# Patient Record
Sex: Male | Born: 1955 | Race: White | Hispanic: No | Marital: Married | Smoking: Never smoker
Health system: Southern US, Community
[De-identification: ages and names within clinical notes are randomized; demographics above are authoritative.]

## PROBLEM LIST (undated history)

## (undated) DIAGNOSIS — I10 Essential (primary) hypertension: Secondary | ICD-10-CM

## (undated) HISTORY — PX: OTHER SURGICAL HISTORY: SHX169

---

## 1998-02-14 ENCOUNTER — Emergency Department (HOSPITAL_COMMUNITY): Admission: EM | Admit: 1998-02-14 | Discharge: 1998-02-14 | Payer: Self-pay | Admitting: Emergency Medicine

## 1999-09-26 ENCOUNTER — Emergency Department (HOSPITAL_COMMUNITY): Admission: EM | Admit: 1999-09-26 | Discharge: 1999-09-26 | Payer: Self-pay | Admitting: Emergency Medicine

## 2002-12-09 ENCOUNTER — Inpatient Hospital Stay (HOSPITAL_COMMUNITY): Admission: EM | Admit: 2002-12-09 | Discharge: 2002-12-11 | Payer: Self-pay | Admitting: Emergency Medicine

## 2004-11-06 IMAGING — NM NM MYOCAR MULTI W/ SPECT
15 series · 60 of 60 positions shown · non-contrast
Comparison: None.

CLINICAL DATA: Chest pain.
 MYOCARDIAL PERFUSION WITH SPECT ? 12/11/02
TECHNIQUE: Zechnetium-EEm sestamibi 30 mCi was injected at rest. 10 mCi of the same radiopharmaceutical was injected after exercise stress using the Bruce protocol. The patient achieved 85% of the maximum predicted heart rate for age.

[Series 1: rc rest cardiolite · 6.8mm · 6.85mm/px · 4 of 17 frames shown (1 of 7)]
[frame 2/17]
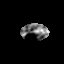
[frame 7/17]
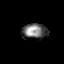
[frame 10/17]
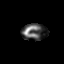
[frame 16/17]
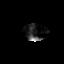

[Series 1: rc rest cardiolite · 6.8mm · 6.85mm/px · 4 of 17 frames shown (2 of 7)]
[frame 2/17]
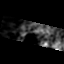
[frame 7/17]
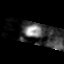
[frame 10/17]
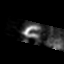
[frame 16/17]
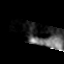

[Series 1: rc rest cardiolite · 6.85mm/px · 4 of 64 frames shown (3 of 7)]
[frame 6/64]
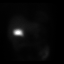
[frame 27/64]
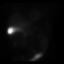
[frame 38/64]
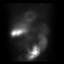
[frame 59/64]
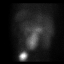

[Series 1: sc stress gated cardio · 6.8mm · 6.85mm/px · 4 of 25 frames shown (1 of 8)]
[frame 3/25]
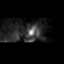
[frame 11/25]
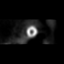
[frame 15/25]
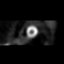
[frame 23/25]
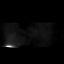

[Series 1: sc stress gated cardio · 6.85mm/px · 4 of 64 frames shown (2 of 8)]
[frame 6/64]
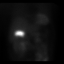
[frame 27/64]
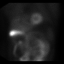
[frame 38/64]
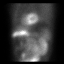
[frame 59/64]
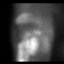

[Series 1: sc stress gated cardio · 6.8mm · 6.85mm/px · 4 of 17 frames shown (3 of 8)]
[frame 4/17]
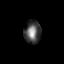
[frame 7/17]
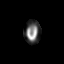
[frame 13/17]
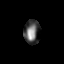
[frame 16/17]
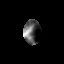

[Series 1: sc stress gated cardio · 6.8mm · 6.85mm/px · 4 of 17 frames shown (4 of 8)]
[frame 4/17]
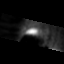
[frame 7/17]
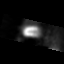
[frame 13/17]
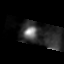
[frame 16/17]
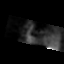

[Series 1: sc stress gated cardio · 6.8mm · 6.85mm/px · 4 of 17 frames shown (5 of 8)]
[frame 4/17]
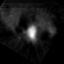
[frame 7/17]
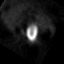
[frame 13/17]
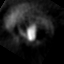
[frame 16/17]
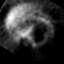

[Series 1: rc rest cardiolite · 6.8mm · 6.85mm/px · 4 of 25 frames shown (4 of 7)]
[frame 3/25]
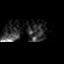
[frame 7/25]
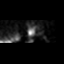
[frame 15/25]
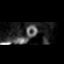
[frame 19/25]
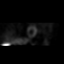

[Series 1: sc stress gated cardio · 6.8mm · 6.85mm/px · 4 of 17 frames shown (6 of 8)]
[frame 2/17]
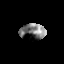
[frame 4/17]
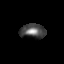
[frame 10/17]
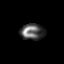
[frame 13/17]
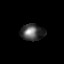

[Series 1: sc stress gated cardio · 6.85mm/px · 4 of 512 frames shown (7 of 8)]
[frame 43/512]
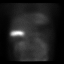
[frame 214/512]
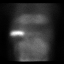
[frame 299/512]
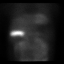
[frame 470/512]
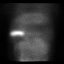

[Series 1: sc stress gated cardio · 6.8mm · 6.85mm/px · 4 of 25 frames shown (8 of 8)]
[frame 3/25]
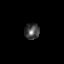
[frame 11/25]
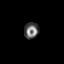
[frame 15/25]
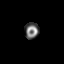
[frame 23/25]
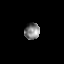

[Series 1: rc rest cardiolite · 6.8mm · 6.85mm/px · 4 of 25 frames shown (5 of 7)]
[frame 3/25]
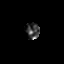
[frame 11/25]
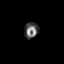
[frame 15/25]
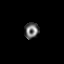
[frame 23/25]
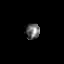

[Series 1: rc rest cardiolite · 6.8mm · 6.85mm/px · 4 of 17 frames shown (6 of 7)]
[frame 2/17]
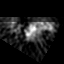
[frame 7/17]
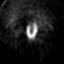
[frame 10/17]
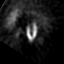
[frame 16/17]
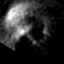

[Series 1: rc rest cardiolite · 6.8mm · 6.85mm/px · 4 of 17 frames shown (7 of 7)]
[frame 2/17]
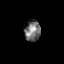
[frame 7/17]
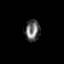
[frame 10/17]
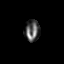
[frame 16/17]
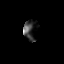

[60 of 60 positions shown; findings below may reference images not displayed]

FINDINGS: SPECT imaging shows a small fixed defect in the inferior wall extending from the midsegment to the apex. This has similar features on both rest and stress imaging. No other fixed or reversible perfusion defects are identified within the left ventricular myocardium. 
 WALL MOTION
 Surface-rendered images obtained with cardiac gating show no segmental wall motion abnormality. 
 EJECTION FRACTION
 The left ventricular end-diastolic volume is 118 cc, and the left ventricular end-systolic volume is 35 cc. The derived left ventricular ejection fraction is 70%.
IMPRESSION: Probable diaphragmatic attenuation involving the mid-distal inferior wall. No definite ischemia or infarct.
 Normal left ventricular ejection fraction.

## 2011-03-05 ENCOUNTER — Emergency Department (HOSPITAL_COMMUNITY)
Admission: EM | Admit: 2011-03-05 | Discharge: 2011-03-05 | Disposition: A | Discharge status: Home / Self Care | Payer: BC Managed Care – PPO | Attending: Emergency Medicine | Admitting: Emergency Medicine

## 2011-03-05 ENCOUNTER — Encounter (HOSPITAL_COMMUNITY): Payer: Self-pay | Admitting: *Deleted

## 2011-03-05 DIAGNOSIS — K645 Perianal venous thrombosis: Secondary | ICD-10-CM

## 2011-03-05 DIAGNOSIS — I10 Essential (primary) hypertension: Secondary | ICD-10-CM | POA: Insufficient documentation

## 2011-03-05 DIAGNOSIS — H109 Unspecified conjunctivitis: Secondary | ICD-10-CM

## 2011-03-05 HISTORY — DX: Essential (primary) hypertension: I10

## 2011-03-05 MED ORDER — TOBRAMYCIN 0.3 % OP SOLN
2.0000 [drp] | OPHTHALMIC | Status: DC
  Administered 2011-03-05: 2 [drp] via OPHTHALMIC
  Filled 2011-03-05: qty 5

## 2011-03-05 NOTE — Discharge Instructions (Signed)
You were seen and treated today for your hemorrhoid pains. Your hemorrhoid was opened to remove the clots and help with your symptoms. Is recommended to use a stool softener daily to have soft stools and continue to help with your symptoms. You may also use warm soaks to help with healing. You were also evaluated today for your complaints of eye irritation and drainage. He has been provided eyedrops to use to help prevent infection and help with symptoms. Please use these by placing 2 drops every 4 hours for the next 5 days. Please followup with your primary doctor next week for continued evaluation of your symptoms.  Hemorrhoids Hemorrhoids are enlarged (dilated) veins around the rectum. There are 2 types of hemorrhoids, and the type of hemorrhoid is determined by its location. Internal hemorrhoids occur in the veins just inside the rectum.They are usually not painful, but they may bleed.However, they may poke through to the outside and become irritated and painful. External hemorrhoids involve the veins outside the anus and can be felt as a painful swelling or hard lump near the anus.They are often itchy and may crack and bleed. Sometimes clots will form in the veins. This makes them swollen and painful. These are called thrombosed hemorrhoids. CAUSES Causes of hemorrhoids include:  Pregnancy. This increases the pressure in the hemorrhoidal veins.   Constipation.   Straining to have a bowel movement.   Obesity.   Heavy lifting or other activity that caused you to strain.  TREATMENT Most of the time hemorrhoids improve in 1 to 2 weeks. However, if symptoms do not seem to be getting better or if you have a lot of rectal bleeding, your caregiver may perform a procedure to help make the hemorrhoids get smaller or remove them completely.Possible treatments include:  Rubber band ligation. A rubber band is placed at the base of the hemorrhoid to cut off the circulation.   Sclerotherapy. A  chemical is injected to shrink the hemorrhoid.   Infrared light therapy. Tools are used to burn the hemorrhoid.   Hemorrhoidectomy. This is surgical removal of the hemorrhoid.  HOME CARE INSTRUCTIONS   Increase fiber in your diet. Ask your caregiver about using fiber supplements.   Drink enough water and fluids to keep your urine clear or pale yellow.   Exercise regularly.   Go to the bathroom when you have the urge to have a bowel movement. Do not wait.   Avoid straining to have bowel movements.   Keep the anal area dry and clean.   Only take over-the-counter or prescription medicines for pain, discomfort, or fever as directed by your caregiver.  If your hemorrhoids are thrombosed:  Take warm sitz baths for 20 to 30 minutes, 3 to 4 times per day.   If the hemorrhoids are very tender and swollen, place ice packs on the area as tolerated. Using ice packs between sitz baths may be helpful. Fill a plastic bag with ice. Place a towel between the bag of ice and your skin.   Medicated creams and suppositories may be used or applied as directed.   Do not use a donut-shaped pillow or sit on the toilet for long periods. This increases blood pooling and pain.  SEEK MEDICAL CARE IF:   You have increasing pain and swelling that is not controlled with your medicine.   You have uncontrolled bleeding.   You have difficulty or you are unable to have a bowel movement.   You have pain or inflammation outside the  area of the hemorrhoids.   You have chills or an oral temperature above 102 F (38.9 C).  MAKE SURE YOU:   Understand these instructions.   Will watch your condition.   Will get help right away if you are not doing well or get worse.  Document Released: 12/25/1999 Document Revised: 09/08/2010 Document Reviewed: 05/01/2007 [facility] Patient Information 2012 [place], [place].   Conjunctivitis Conjunctivitis is commonly called "pink eye." Conjunctivitis can be caused by  bacterial or viral infection, allergies, or injuries. There is usually redness of the lining of the eye, itching, discomfort, and sometimes discharge. There may be deposits of matter along the eyelids. A viral infection usually causes a watery discharge, while a bacterial infection causes a yellowish, thick discharge. Pink eye is very contagious and spreads by direct contact. You may be given antibiotic eyedrops as part of your treatment. Before using your eye medicine, remove all drainage from the eye by washing gently with warm water and cotton balls. Continue to use the medication until you have awakened 2 mornings in a row without discharge from the eye. Do not rub your eye. This increases the irritation and helps spread infection. Use separate towels from other household members. Wash your hands with soap and water before and after touching your eyes. Use cold compresses to reduce pain and sunglasses to relieve irritation from light. Do not wear contact lenses or wear eye makeup until the infection is gone. SEEK MEDICAL CARE IF:   Your symptoms are not better after 3 days of treatment.   You have increased pain or trouble seeing.   The outer eyelids become very red or swollen.  Document Released: 02/04/2004 Document Revised: 09/08/2010 Document Reviewed: 12/27/2004 [facility] Patient Information 2012 [place], [place].

## 2011-03-05 NOTE — ED Notes (Signed)
Patient presents to ED with c/o a "hemmorhoid flare up" that started yesterday around lunchtime. Reports having hx of hemorrhoidectomy approx. 8 years ago.

## 2011-03-05 NOTE — ED Provider Notes (Signed)
History     CSN: [id number]  Arrival date & time 03/05/11  0434   First MD Initiated Contact with Patient 03/05/11 0440      Chief Complaint  Patient presents with  . Hemorrhoids     HPI  History provided by the patient. Patient is a 56 year old male with history of hypertension and previous hemorrhoids who presents with complaints of "hemorrhoid" pains. Patient reports having increasing pain around the rectum from "hemorrhoid" that began yesterday afternoon and evening. Patient denies having any recent diarrhea or constipation. Symptoms became worse after attending the dual caused a combination of and laughing. Patient was trying to rest and use over-the-counter pain medications without significant relief. Pain is worse with pressure sitting. Patient denies any other aggravating or alleviating factors. He denies having any bleeding. Patient also has second complaint of left eye irritation, redness and drainage. Patient does report being on prednisone recently for rash to right arm. He reports having one day remaining of prednisone dose. I irritation began about 4 days ago. He denies any nasal congestion, rhinorrhea, cough, sinus pressure, fever, chills. Patient denies being around any known sick contacts.    Past Medical History  Diagnosis Date  . Hypertension     Past Surgical History  Procedure Date  . Hemmorhoidectomy     No family history on file.  History  Substance Use Topics  . Smoking status: Not on file  . Smokeless tobacco: Not on file  . Alcohol Use:       Review of Systems  Constitutional: Negative for fever and chills.  HENT: Negative for congestion and rhinorrhea.   Eyes: Positive for discharge and redness. Negative for photophobia, pain and visual disturbance.  Respiratory: Negative for cough and shortness of breath.   Gastrointestinal: Negative for abdominal pain, diarrhea, constipation and blood in stool.  All other systems reviewed and are  negative.    Allergies  Review of patient's allergies indicates no known allergies.  Home Medications   Current Outpatient Rx  Name Route Sig Dispense Refill  . LISINOPRIL 10 MG PO TABS Oral Take 10 mg by mouth daily.      BP 148/72  Pulse 82  Temp(Src) 98.7 F (37.1 C) (Oral)  Resp 18  SpO2 98%  Physical Exam  Nursing note and vitals reviewed. Constitutional: He is oriented to person, place, and time. He appears well-developed and well-nourished. No distress.  HENT:  Head: Normocephalic and atraumatic.  Mouth/Throat: Oropharynx is clear and moist.  Eyes: EOM are normal. Pupils are equal, round, and reactive to light.       Erythema and swelling of the conjunctiva of the left eye. There is crusting with slight matting. Sclera appears normal.  Cardiovascular: Normal rate and regular rhythm.   Pulmonary/Chest: Effort normal and breath sounds normal. No respiratory distress. He has no wheezes. He has no rales.  Abdominal: Soft. He exhibits no distension. There is no tenderness.  Genitourinary:          Tender thrombosed hemorrhoid on left rectum area  Neurological: He is alert and oriented to person, place, and time.  Skin: Skin is warm. No rash noted.  Psychiatric: He has a normal mood and affect. His behavior is normal.    ED Course  Procedures   HEMORRHOIDECTOMY   Performed by: Angus Seller Consent: Verbal consent obtained from patient. Risks and benefits: risks, benefits and alternatives were discussed Type: Thrombosed hemorrhoid   Body area: Right lateral rectum  Anesthesia: local infiltration  Local anesthetic: lidocaine 2 % with epinephrine  Anesthetic total: 2 ml  Complexity: Simple  Blood clots removed   Patient tolerance: Patient tolerated the procedure well with no immediate complications.     1. Conjunctivitis   2. External hemorrhoid, thrombosed       MDM  4:50 AM patient is evaluated. Patient in no acute  distress.        Angus Seller, [place] 03/05/11 [phone]

## 2011-03-05 NOTE — ED Notes (Signed)
Ivonne Andrew, PA and [place], [place] to bedside for incision and drainage of hemorrhoid.

## 2011-03-05 NOTE — ED Provider Notes (Signed)
Medical screening examination/treatment/procedure(s) were performed by non-physician practitioner and as supervising physician I was immediately available for consultation/collaboration.   Doloras Tellado, MD 03/05/11 0543 

## 2011-03-08 ENCOUNTER — Encounter (HOSPITAL_COMMUNITY): Payer: Self-pay | Admitting: Emergency Medicine

## 2011-03-08 ENCOUNTER — Emergency Department (HOSPITAL_COMMUNITY)
Admission: EM | Admit: 2011-03-08 | Discharge: 2011-03-08 | Disposition: A | Discharge status: Home / Self Care | Payer: BC Managed Care – PPO | Attending: Emergency Medicine | Admitting: Emergency Medicine

## 2011-03-08 DIAGNOSIS — K6289 Other specified diseases of anus and rectum: Secondary | ICD-10-CM | POA: Insufficient documentation

## 2011-03-08 DIAGNOSIS — K645 Perianal venous thrombosis: Secondary | ICD-10-CM | POA: Insufficient documentation

## 2011-03-08 DIAGNOSIS — Z79899 Other long term (current) drug therapy: Secondary | ICD-10-CM | POA: Insufficient documentation

## 2011-03-08 DIAGNOSIS — I10 Essential (primary) hypertension: Secondary | ICD-10-CM | POA: Insufficient documentation

## 2011-03-08 MED ORDER — HYDROCODONE-ACETAMINOPHEN 5-500 MG PO TABS: 1.0000 | ORAL_TABLET | Freq: Four times a day (QID) | ORAL | Status: AC | PRN

## 2011-03-08 MED ORDER — NAPROXEN 500 MG PO TABS: 500.0000 mg | ORAL_TABLET | Freq: Two times a day (BID) | ORAL | Status: AC

## 2011-03-08 MED ORDER — LIDOCAINE HCL 1 % IJ SOLN
5.0000 mL | Freq: Once | INTRAMUSCULAR | Status: AC
  Administered 2011-03-08: 5 mL via INTRADERMAL
  Filled 2011-03-08: qty 20

## 2011-03-08 NOTE — ED Provider Notes (Signed)
History     CSN: [id number]  Arrival date & time 03/08/11  0118   First MD Initiated Contact with Patient 03/08/11 [phone]      Chief Complaint  Patient presents with  . Hemorrhoids    (Consider location/radiation/quality/duration/timing/severity/associated sxs/prior treatment) HPI Comments: 56 year old male with history of hemorrhoid who presents approximately 3 days after excision of the thrombosed hemorrhoid. He states that initially this improved his pain but over the last 24 hours he has developed this recurrent swelling and fullness in his anus that feels similar to prior hemorrhoid. Symptoms are constant, gradually getting worse, moderate to severe and worse with sitting and palpation. He denies any rectal bleeding, vomiting, fevers, abdominal pain.  The history is provided by the patient and medical records.    Past Medical History  Diagnosis Date  . Hypertension     Past Surgical History  Procedure Date  . Hemmorhoidectomy     No family history on file.  History  Substance Use Topics  . Smoking status: Not on file  . Smokeless tobacco: Not on file  . Alcohol Use:       Review of Systems  All other systems reviewed and are negative.    Allergies  Review of patient's allergies indicates no known allergies.  Home Medications   Current Outpatient Rx  Name Route Sig Dispense Refill  . HYDROCODONE-ACETAMINOPHEN 5-500 MG PO TABS Oral Take 1-2 tablets by mouth every 6 (six) hours as needed for pain. 15 tablet 0  . LISINOPRIL 10 MG PO TABS Oral Take 10 mg by mouth daily.    Marland Kitchen NAPROXEN 500 MG PO TABS Oral Take 1 tablet (500 mg total) by mouth 2 (two) times daily with a meal. 30 tablet 0    BP 138/92  Pulse 70  Temp(Src) 98.4 F (36.9 C) (Oral)  Resp 18  Wt 195 lb (88.451 kg)  SpO2 98%  Physical Exam  Nursing note and vitals reviewed. Constitutional: He appears well-developed and well-nourished. No distress.  HENT:  Head: Normocephalic and atraumatic.    Mouth/Throat: Oropharynx is clear and moist. No oropharyngeal exudate.  Eyes: Conjunctivae and EOM are normal. Pupils are equal, round, and reactive to light. Right eye exhibits no discharge. Left eye exhibits no discharge. No scleral icterus.  Neck: Normal range of motion. Neck supple. No JVD present. No thyromegaly present.  Cardiovascular: Normal rate, regular rhythm, normal heart sounds and intact distal pulses.  Exam reveals no gallop and no friction rub.   No murmur heard. Pulmonary/Chest: Effort normal and breath sounds normal. No respiratory distress. He has no wheezes. He has no rales.  Abdominal: Soft. Bowel sounds are normal. He exhibits no distension and no mass. There is no tenderness.  Genitourinary:       Thrombosed external hemorrhoid in the right perianal area. This is tender to palpation, nonbleeding  Musculoskeletal: Normal range of motion. He exhibits no edema and no tenderness.  Lymphadenopathy:    He has no cervical adenopathy.  Neurological: He is alert. Coordination normal.  Skin: Skin is warm and dry. No rash noted. No erythema.  Psychiatric: He has a normal mood and affect. His behavior is normal.    ED Course  Procedures (including critical care time)  Labs Reviewed - No data to display No results found.   1. Thrombosed external hemorrhoid       MDM  Patient appears to have recurrent thrombosed hemorrhoid, will require incision and drainage, otherwise appears stable  INCISION AND DRAINAGE Performed  by: [facility] Consent: Verbal consent obtained. Risks and benefits: risks, benefits and alternatives were discussed Type: abscess  Body area: Thrombosed external hemorrhoid  Anesthesia: local infiltration  Local anesthetic: lidocaine 1 % without epinephrine  Anesthetic total: 0.5 ml  Complexity: complex Blunt dissection to break up loculations  Drainage: Blood clot   Drainage amount: 1 large clot   Packing material: None   Patient  tolerance: Patient tolerated the procedure well with no immediate complications.         Vida Roller, MD 03/08/11 [phone]

## 2011-03-08 NOTE — ED Notes (Signed)
Pt states he began hurting around 0800 this morning and it kept getting worse throughout the day.  Could not sleep due to the pain in his bottom.  Pain 7/10.

## 2011-03-08 NOTE — ED Notes (Signed)
Pt alert, nad, c/o rectal pain and itching associated with a hemorrhoids, resp even unlabored, skin pwd

## 2011-03-08 NOTE — Discharge Instructions (Signed)
Please take a laxative, use SITZ baths twice daily - get from pharmacy - and return to your doctor in 2 days for a recheck.  RESOURCE GUIDE  Dental Problems  Patients with Medicaid: [facility] [phone] [address]                                           [phone] [address] Phone:  [phone]                                                  Phone:  [phone]  If unable to pay or uninsured, contact:  [facility] or [facility]. to become qualified for the adult dental clinic.  Chronic Pain Problems Contact [facility]  [phone] Patients need to be referred by their primary care doctor.  Insufficient Money for Medicine Contact [employer]:  call "211" or [facility] [phone] [phone].  No Primary Care Doctor Call [facility]  [phone] Other agencies that provide inexpensive medical care    [facility]  [phone]    [facility]  [phone]    [facility]  [phone]    [facility]  [phone]    [facility]  [phone]    [facility]  [phone]  Psychological Services [facility]  [phone] [facility]  [phone] [facility]   [phone] (emergency services [phone])  Substance Abuse Resources [facility]  [phone] [facility] [phone] [facility] [phone] Floydene Flock [phone] Residential & Outpatient Substance Abuse Program  [phone]  Abuse/Neglect [facility] Child Abuse Hotline (931)794-5917 [facility] Child Abuse Hotline (949)839-2252 (After Hours)  Emergency Shelter [facility] [phone]  M[facility] 617-503-9284 [facility] [phone]  MRSA Hotline #:   763-161-6851    [facility] Resources  [facility]     [employer]                          [facility] Dept. [address] [place]                       [address]      [address]  [place]                                                Cristobal Goldmann Phone:  [phone]                                   Phone:  [phone]  Phone:  [phone]  [facility] Phone:  [phone]  [facility] Child Abuse Hotline 807-031-2190 [phone] (After Hours)

## 2011-03-08 NOTE — ED Notes (Signed)
Hemorrhoid decompressed and clot removed by Eber Hong, MD.  Marcelle Overlie at bedside during procedure.  Moderate sized clot removed.  Pt tolerated well.

## 2013-06-07 ENCOUNTER — Encounter (HOSPITAL_COMMUNITY): Payer: Self-pay | Admitting: Emergency Medicine

## 2013-06-07 ENCOUNTER — Emergency Department (HOSPITAL_COMMUNITY)
Admission: EM | Admit: 2013-06-07 | Discharge: 2013-06-07 | Disposition: A | Discharge status: Home / Self Care | Payer: BC Managed Care – PPO | Attending: Emergency Medicine | Admitting: Emergency Medicine

## 2013-06-07 ENCOUNTER — Emergency Department (HOSPITAL_COMMUNITY): Payer: BC Managed Care – PPO

## 2013-06-07 DIAGNOSIS — S62639B Displaced fracture of distal phalanx of unspecified finger, initial encounter for open fracture: Secondary | ICD-10-CM | POA: Insufficient documentation

## 2013-06-07 DIAGNOSIS — I10 Essential (primary) hypertension: Secondary | ICD-10-CM | POA: Insufficient documentation

## 2013-06-07 DIAGNOSIS — W298XXA Contact with other powered powered hand tools and household machinery, initial encounter: Secondary | ICD-10-CM | POA: Insufficient documentation

## 2013-06-07 DIAGNOSIS — Z79899 Other long term (current) drug therapy: Secondary | ICD-10-CM | POA: Insufficient documentation

## 2013-06-07 DIAGNOSIS — Y929 Unspecified place or not applicable: Secondary | ICD-10-CM | POA: Insufficient documentation

## 2013-06-07 DIAGNOSIS — Y93H2 Activity, gardening and landscaping: Secondary | ICD-10-CM | POA: Insufficient documentation

## 2013-06-07 DIAGNOSIS — IMO0002 Reserved for concepts with insufficient information to code with codable children: Secondary | ICD-10-CM | POA: Insufficient documentation

## 2013-06-07 DIAGNOSIS — IMO0001 Reserved for inherently not codable concepts without codable children: Secondary | ICD-10-CM

## 2013-06-07 MED ORDER — SULFAMETHOXAZOLE-TMP DS 800-160 MG PO TABS
1.0000 | ORAL_TABLET | Freq: Once | ORAL | Status: AC
  Administered 2013-06-07: 1 via ORAL
  Filled 2013-06-07: qty 1

## 2013-06-07 MED ORDER — CEPHALEXIN 500 MG PO CAPS: 500.0000 mg | ORAL_CAPSULE | Freq: Two times a day (BID) | ORAL | Status: AC

## 2013-06-07 MED ORDER — SULFAMETHOXAZOLE-TMP DS 800-160 MG PO TABS: 1.0000 | ORAL_TABLET | Freq: Two times a day (BID) | ORAL | Status: AC

## 2013-06-07 MED ORDER — CEFAZOLIN SODIUM 1 G IJ SOLR
1.0000 g | Freq: Once | INTRAMUSCULAR | Status: DC
  Filled 2013-06-07: qty 10

## 2013-06-07 MED ORDER — OXYCODONE-ACETAMINOPHEN 5-325 MG PO TABS
2.0000 | ORAL_TABLET | Freq: Once | ORAL | Status: AC
  Administered 2013-06-07: 2 via ORAL
  Filled 2013-06-07: qty 2

## 2013-06-07 MED ORDER — OXYCODONE-ACETAMINOPHEN 5-325 MG PO TABS: 1.0000 | ORAL_TABLET | Freq: Four times a day (QID) | ORAL | Status: AC | PRN

## 2013-06-07 MED ORDER — CEPHALEXIN 250 MG PO CAPS
1000.0000 mg | ORAL_CAPSULE | Freq: Once | ORAL | Status: AC
  Administered 2013-06-07: 1000 mg via ORAL
  Filled 2013-06-07: qty 4

## 2013-06-07 NOTE — Progress Notes (Signed)
Orthopedic Tech Progress Note Patient Details:  Roberto Gill Jan 08, 1956 [id number] Finger splint applied to 3rd digit, left hand after bandaging Ortho Devices Type of Ortho Device: Finger splint Ortho Device/Splint Location: Left hand Ortho Device/Splint Interventions: Application   Asia R Thompson 06/07/2013, 12:51 PM

## 2013-06-07 NOTE — ED Notes (Signed)
Ortho still at bedside with patient.

## 2013-06-07 NOTE — ED Notes (Signed)
Pt reports left middle finger cut with hedge trimmer this am prior to arrival. Tip cut, still bleeding. Last tetanus 4 years ago.

## 2013-06-07 NOTE — ED Provider Notes (Signed)
CSN: [id number]     Arrival date & time 06/07/13  2595 History   First MD Initiated Contact with Patient 06/07/13 0908    This chart was scribed for non-physician practitioner, Junius Finner, working with Hilario Quarry, MD by Marica Otter, ED Scribe. This patient was seen in room TR08C/TR08C and the patient's care was started at 9:37 AM.  Chief Complaint  Patient presents with  . Finger Injury   The history is provided by the patient. No language interpreter was used.   HPI Comments: Roberto Gill is a 58 y.o. male, with a history of HTN, who presents to the Emergency Department complaining of a laceration to the left middle finger onset around 8am this morning. Pt reports that he was trimming hedges when he cut the top of his middle finger with a manual hedge trimmer. Pt reports he is left handed. Pt reports the pain at the moment is a 8 out of 10. Pt reports he is able to bend the injured finger. Pt reports the last time he ate or drank anything was at 9pm last night. Pt denies any allergies to meds. Pt reports his last tetanus shot was 4 years ago.   Past Medical History  Diagnosis Date  . Hypertension    Past Surgical History  Procedure Laterality Date  . Hemmorhoidectomy     No family history on file. History  Substance Use Topics  . Smoking status: Never Smoker   . Smokeless tobacco: Not on file  . Alcohol Use: No    Review of Systems  Skin: Positive for wound (laceration of left middle finger ).  All other systems reviewed and are negative.     Allergies  Review of patient's allergies indicates no known allergies.  Home Medications   Prior to Admission medications   Medication Sig Start Date End Date Taking? Authorizing Provider  lisinopril (PRINIVIL,ZESTRIL) 10 MG tablet Take 10 mg by mouth daily.   Yes Historical Provider, MD  cephALEXin (KEFLEX) 500 MG capsule Take 1 capsule (500 mg total) by mouth 2 (two) times daily. 06/07/13   Junius Finner, PA-C   oxyCODONE-acetaminophen (PERCOCET/ROXICET) 5-325 MG per tablet Take 1-2 tablets by mouth every 6 (six) hours as needed for moderate pain or severe pain. 06/07/13   Junius Finner, PA-C  sulfamethoxazole-trimethoprim (BACTRIM DS) 800-160 MG per tablet Take 1 tablet by mouth 2 (two) times daily. 06/07/13   Junius Finner, PA-C   Triage Vitals: BP 108/65  Temp(Src) 98 F (36.7 C) (Oral)  Resp 16  Ht 5\' 9"  (1.753 m)  Wt 200 lb (90.719 kg)  BMI 29.52 kg/m2  SpO2 94% Physical Exam  Nursing note and vitals reviewed. Constitutional: He is oriented to person, place, and time. He appears well-developed and well-nourished.  HENT:  Head: Normocephalic and atraumatic.  Eyes: Conjunctivae and EOM are normal. No scleral icterus.  Neck: Normal range of motion.  Cardiovascular: Normal rate, regular rhythm and normal heart sounds.   Pulmonary/Chest: Effort normal and breath sounds normal. No respiratory distress. He has no wheezes. He has no rales. He exhibits no tenderness.  Abdominal: Soft. Bowel sounds are normal. He exhibits no distension and no mass. There is no tenderness. There is no rebound and no guarding.  Musculoskeletal: Normal range of motion.  Partial amputation of left, middle finger distal phalanx radial side. No nailbed involvement. Skin intact on molar aspect. Bone exposed. FROM of left finger. Sensation to skin flap absent.  Neurological: He is alert and oriented  to person, place, and time.  Skin: Skin is warm and dry. Laceration (left middle finger) noted.     Psychiatric: He has a normal mood and affect. His behavior is normal.    ED Course  Procedures   LACERATION REPAIR Performed by: Junius FinnerErin O'Malley Authorized by: Junius FinnerErin O'Malley Consent: Verbal consent obtained. Risks and benefits: risks, benefits and alternatives were discussed Consent given by: patient Patient identity confirmed: provided demographic data Prepped and Draped in normal sterile fashion Wound  explored  Laceration Location: left distal middle phalanx   Laceration Length: 4cm  No Foreign Bodies seen or palpated  Anesthesia: digital block  Local anesthetic: lidocaine 2% without epinephrine  Anesthetic total: 1ml  Irrigation method: syringe Amount of cleaning: standard  Skin closure: complex, 4-0 prolene  Number of sutures: 7  Technique: interrupted   Patient tolerance: Patient tolerated the procedure well with no immediate complications.   DIAGNOSTIC STUDIES: Oxygen Saturation is 94% on RA, adequate by my interpretation.    COORDINATION OF CARE:  9:40 AM-Discussed treatment plan which includes consult with ortho and attending with pt at bedside and pt agreed to plan. Pt declined pain meds.   Labs Review Labs Reviewed - No data to display  Imaging Review Dg Finger Middle Left  06/07/2013   CLINICAL DATA:  Pain post trauma  EXAM: LEFT THIRD FINGER 2+V  COMPARISON:  None.  FINDINGS: Frontal, oblique, and lateral views were obtained. There is amputation of the lateral aspect of the third digit distally. There is amputation of the lateral distal most aspect of the third distal phalanx with displaced fragment. No other fracture. No dislocation. Joint spaces appear intact.  IMPRESSION: Distal soft tissue amputation. A portion of the distal aspect of the third distal phalanx laterally is fractured and displaced along with the avulsed soft tissue fragment. No other fracture. No dislocation. No radiopaque foreign body.   Electronically Signed   By: Bretta BangWilliam  Woodruff M.D.   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 Consulted with Dr. Mina MarbleWeingold, hand surgery who agreed laceration may be sutured in ED, finger splinted, f/u with Dr. Mina MarbleWeingold next week, Tuesday, 6/2.  Laceration repaired, see procedure note  above. Bactrim and keflex prescribed with percocet. Home care instructions given. Return precautions provided. Pt verbalized understanding and agreement with tx plan.   I personally performed the services described in this documentation, which was scribed in my presence. The recorded information has been reviewed and is accurate.   Junius Finnerrin O'Malley, PA-C 06/07/13 1658

## 2013-06-07 NOTE — ED Notes (Signed)
PA in room to suture patient.

## 2013-06-07 NOTE — ED Notes (Signed)
Ortho called and they are on the way to apply finger splint and wrap.

## 2013-06-10 NOTE — ED Provider Notes (Signed)
History/physical exam/procedure(s) were performed by non-physician practitioner and as supervising physician I was immediately available for consultation/collaboration. I have reviewed all notes and am in agreement with care and plan.   Hilario Quarry, MD 06/10/13 [phone]

## 2015-05-04 IMAGING — CR DG FINGER MIDDLE 2+V*L*
3 series · 3 of 3 positions shown · non-contrast
Comparison: None.

CLINICAL DATA: Pain post trauma

EXAM:
LEFT THIRD FINGER 2+V

[x finger pa left]
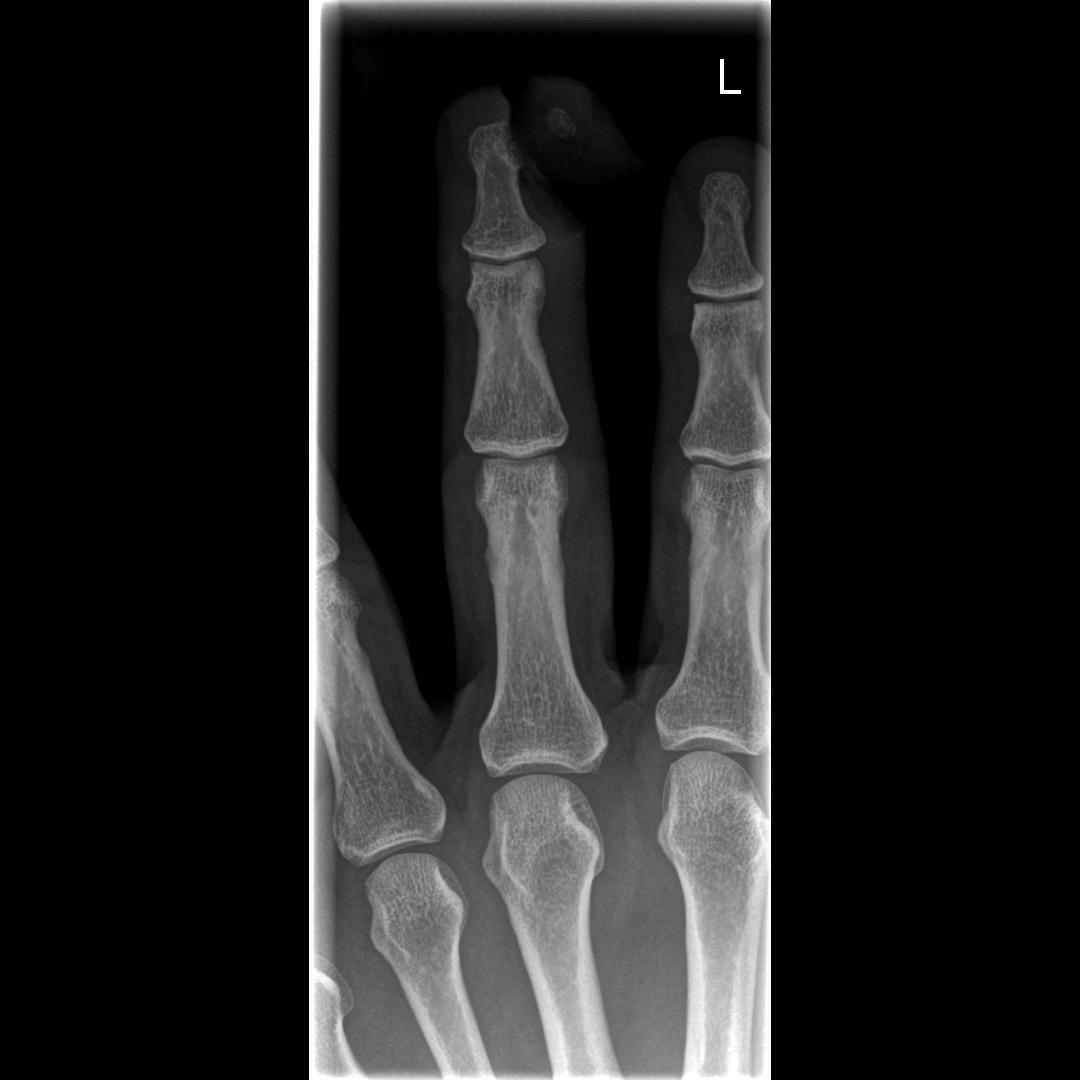

[x finger obl. left]
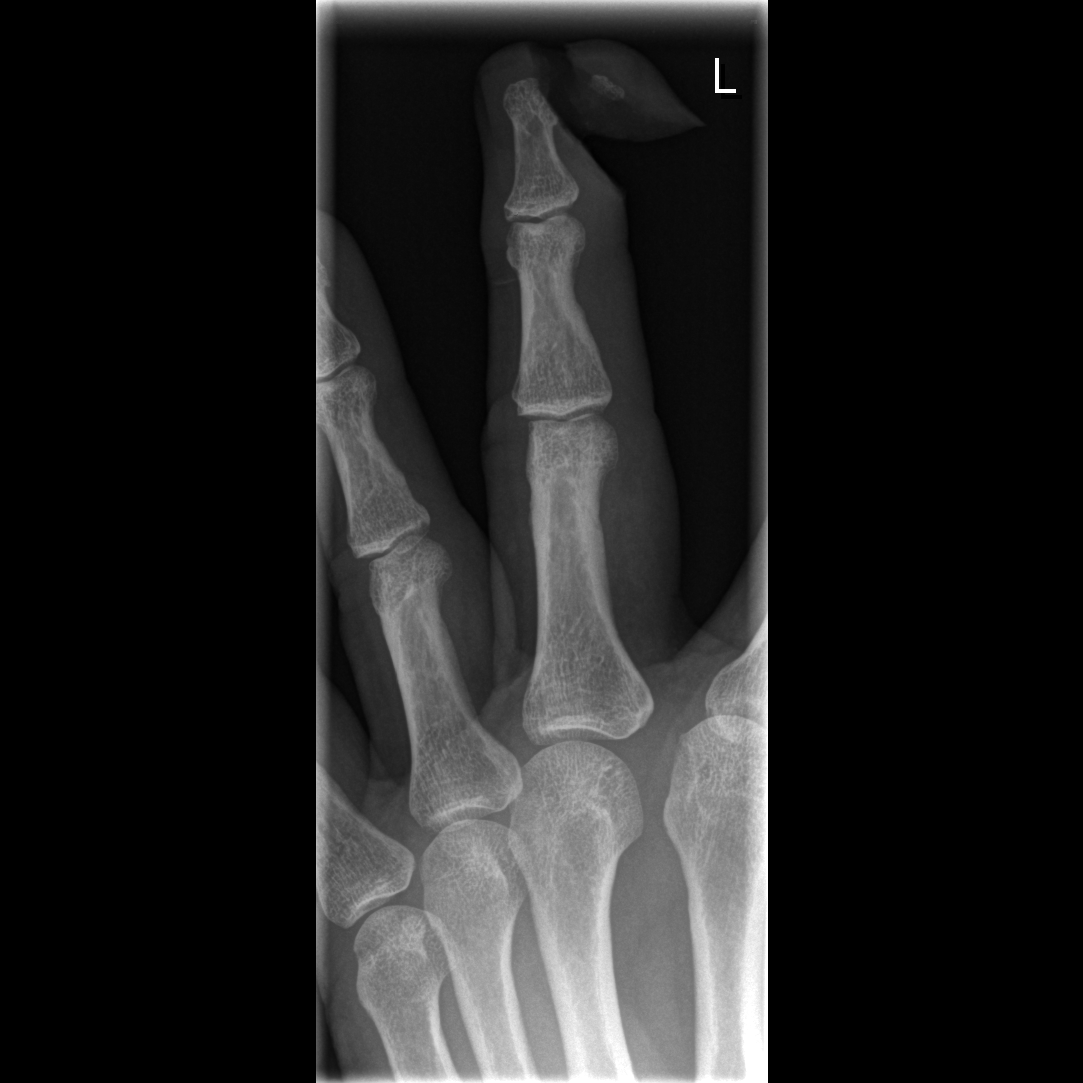

[x finger lateral left]
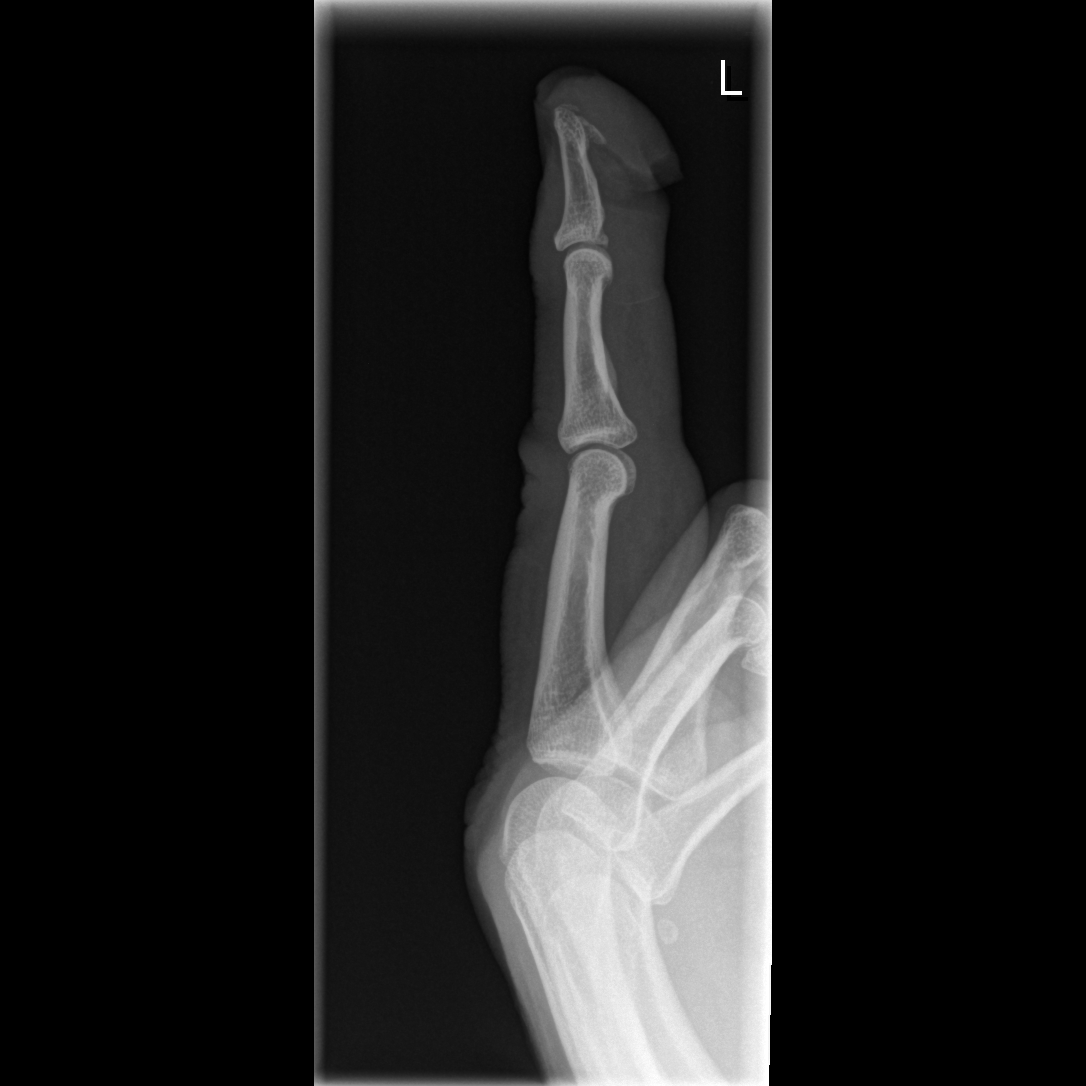

[3 of 3 positions shown; findings below may reference images not displayed]

FINDINGS: Frontal, oblique, and lateral views were obtained. There is
amputation of the lateral aspect of the third digit distally. There
is amputation of the lateral distal most aspect of the third distal
phalanx with displaced fragment. No other fracture. No dislocation.
Joint spaces appear intact.
IMPRESSION: Distal soft tissue amputation. A portion of the distal aspect of the
third distal phalanx laterally is fractured and displaced along with
the avulsed soft tissue fragment. No other fracture. No dislocation.
No radiopaque foreign body.

## 2019-08-12 DIAGNOSIS — H353131 Nonexudative age-related macular degeneration, bilateral, early dry stage: Secondary | ICD-10-CM | POA: Diagnosis not present

## 2019-08-20 DIAGNOSIS — Z1211 Encounter for screening for malignant neoplasm of colon: Secondary | ICD-10-CM | POA: Diagnosis not present

## 2019-09-09 DIAGNOSIS — I10 Essential (primary) hypertension: Secondary | ICD-10-CM | POA: Diagnosis not present

## 2019-09-09 DIAGNOSIS — Z23 Encounter for immunization: Secondary | ICD-10-CM | POA: Diagnosis not present

## 2019-11-07 DIAGNOSIS — S80861A Insect bite (nonvenomous), right lower leg, initial encounter: Secondary | ICD-10-CM | POA: Diagnosis not present

## 2019-11-07 DIAGNOSIS — L814 Other melanin hyperpigmentation: Secondary | ICD-10-CM | POA: Diagnosis not present

## 2019-11-07 DIAGNOSIS — L821 Other seborrheic keratosis: Secondary | ICD-10-CM | POA: Diagnosis not present

## 2019-11-07 DIAGNOSIS — L570 Actinic keratosis: Secondary | ICD-10-CM | POA: Diagnosis not present

## 2019-11-07 DIAGNOSIS — Z85828 Personal history of other malignant neoplasm of skin: Secondary | ICD-10-CM | POA: Diagnosis not present

## 2020-02-13 DIAGNOSIS — J069 Acute upper respiratory infection, unspecified: Secondary | ICD-10-CM | POA: Diagnosis not present

## 2020-02-13 DIAGNOSIS — J209 Acute bronchitis, unspecified: Secondary | ICD-10-CM | POA: Diagnosis not present

## 2020-03-12 DIAGNOSIS — Z125 Encounter for screening for malignant neoplasm of prostate: Secondary | ICD-10-CM | POA: Diagnosis not present

## 2020-03-12 DIAGNOSIS — N529 Male erectile dysfunction, unspecified: Secondary | ICD-10-CM | POA: Diagnosis not present

## 2020-03-12 DIAGNOSIS — Z0000 Encounter for general adult medical examination without abnormal findings: Secondary | ICD-10-CM | POA: Diagnosis not present

## 2020-03-12 DIAGNOSIS — N1831 Chronic kidney disease, stage 3a: Secondary | ICD-10-CM | POA: Diagnosis not present

## 2020-03-12 DIAGNOSIS — I10 Essential (primary) hypertension: Secondary | ICD-10-CM | POA: Diagnosis not present

## 2020-05-07 DIAGNOSIS — D2272 Melanocytic nevi of left lower limb, including hip: Secondary | ICD-10-CM | POA: Diagnosis not present

## 2020-05-07 DIAGNOSIS — Z85828 Personal history of other malignant neoplasm of skin: Secondary | ICD-10-CM | POA: Diagnosis not present

## 2020-05-07 DIAGNOSIS — L814 Other melanin hyperpigmentation: Secondary | ICD-10-CM | POA: Diagnosis not present

## 2020-05-07 DIAGNOSIS — L821 Other seborrheic keratosis: Secondary | ICD-10-CM | POA: Diagnosis not present

## 2020-05-07 DIAGNOSIS — D225 Melanocytic nevi of trunk: Secondary | ICD-10-CM | POA: Diagnosis not present

## 2020-05-07 DIAGNOSIS — D1801 Hemangioma of skin and subcutaneous tissue: Secondary | ICD-10-CM | POA: Diagnosis not present

## 2020-05-07 DIAGNOSIS — L570 Actinic keratosis: Secondary | ICD-10-CM | POA: Diagnosis not present

## 2020-08-13 DIAGNOSIS — H353132 Nonexudative age-related macular degeneration, bilateral, intermediate dry stage: Secondary | ICD-10-CM | POA: Diagnosis not present

## 2020-11-16 DIAGNOSIS — L814 Other melanin hyperpigmentation: Secondary | ICD-10-CM | POA: Diagnosis not present

## 2020-11-16 DIAGNOSIS — L570 Actinic keratosis: Secondary | ICD-10-CM | POA: Diagnosis not present

## 2020-11-16 DIAGNOSIS — D225 Melanocytic nevi of trunk: Secondary | ICD-10-CM | POA: Diagnosis not present

## 2020-11-16 DIAGNOSIS — Z85828 Personal history of other malignant neoplasm of skin: Secondary | ICD-10-CM | POA: Diagnosis not present

## 2020-11-16 DIAGNOSIS — B078 Other viral warts: Secondary | ICD-10-CM | POA: Diagnosis not present

## 2021-02-17 DIAGNOSIS — J111 Influenza due to unidentified influenza virus with other respiratory manifestations: Secondary | ICD-10-CM | POA: Diagnosis not present

## 2021-02-17 DIAGNOSIS — R091 Pleurisy: Secondary | ICD-10-CM | POA: Diagnosis not present

## 2021-02-17 DIAGNOSIS — J209 Acute bronchitis, unspecified: Secondary | ICD-10-CM | POA: Diagnosis not present

## 2021-03-18 DIAGNOSIS — N1831 Chronic kidney disease, stage 3a: Secondary | ICD-10-CM | POA: Diagnosis not present

## 2021-03-18 DIAGNOSIS — I10 Essential (primary) hypertension: Secondary | ICD-10-CM | POA: Diagnosis not present

## 2021-03-18 DIAGNOSIS — Z0000 Encounter for general adult medical examination without abnormal findings: Secondary | ICD-10-CM | POA: Diagnosis not present

## 2021-03-18 DIAGNOSIS — Z23 Encounter for immunization: Secondary | ICD-10-CM | POA: Diagnosis not present

## 2021-03-18 DIAGNOSIS — Z125 Encounter for screening for malignant neoplasm of prostate: Secondary | ICD-10-CM | POA: Diagnosis not present

## 2021-06-16 DIAGNOSIS — Z85828 Personal history of other malignant neoplasm of skin: Secondary | ICD-10-CM | POA: Diagnosis not present

## 2021-06-16 DIAGNOSIS — L570 Actinic keratosis: Secondary | ICD-10-CM | POA: Diagnosis not present

## 2021-06-16 DIAGNOSIS — L814 Other melanin hyperpigmentation: Secondary | ICD-10-CM | POA: Diagnosis not present

## 2021-06-16 DIAGNOSIS — D225 Melanocytic nevi of trunk: Secondary | ICD-10-CM | POA: Diagnosis not present

## 2021-06-16 DIAGNOSIS — L821 Other seborrheic keratosis: Secondary | ICD-10-CM | POA: Diagnosis not present

## 2021-08-19 DIAGNOSIS — H353132 Nonexudative age-related macular degeneration, bilateral, intermediate dry stage: Secondary | ICD-10-CM | POA: Diagnosis not present

## 2021-10-09 DIAGNOSIS — Z23 Encounter for immunization: Secondary | ICD-10-CM | POA: Diagnosis not present

## 2021-11-23 ENCOUNTER — Emergency Department (HOSPITAL_BASED_OUTPATIENT_CLINIC_OR_DEPARTMENT_OTHER): Payer: Medicare PPO | Admitting: Radiology

## 2021-11-23 ENCOUNTER — Other Ambulatory Visit: Payer: Self-pay

## 2021-11-23 ENCOUNTER — Emergency Department (HOSPITAL_BASED_OUTPATIENT_CLINIC_OR_DEPARTMENT_OTHER)
Admission: EM | Admit: 2021-11-23 | Discharge: 2021-11-23 | Disposition: A | Discharge status: Home / Self Care | Payer: Medicare PPO | Attending: Emergency Medicine | Admitting: Emergency Medicine

## 2021-11-23 DIAGNOSIS — M25511 Pain in right shoulder: Secondary | ICD-10-CM | POA: Diagnosis not present

## 2021-11-23 DIAGNOSIS — Y92410 Unspecified street and highway as the place of occurrence of the external cause: Secondary | ICD-10-CM | POA: Insufficient documentation

## 2021-11-23 DIAGNOSIS — Z041 Encounter for examination and observation following transport accident: Secondary | ICD-10-CM | POA: Diagnosis not present

## 2021-11-23 DIAGNOSIS — M25512 Pain in left shoulder: Secondary | ICD-10-CM | POA: Diagnosis not present
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
## 2021-11-23 NOTE — Discharge Instructions (Addendum)
Take Tylenol Motrin for pain.  Your x-ray today was very reassuring.  You have any new symptoms return to the ED for additional evaluation or follow-up with your primary.
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
## 2021-12-16 DIAGNOSIS — L821 Other seborrheic keratosis: Secondary | ICD-10-CM | POA: Diagnosis not present

## 2021-12-16 DIAGNOSIS — L570 Actinic keratosis: Secondary | ICD-10-CM | POA: Diagnosis not present

## 2021-12-16 DIAGNOSIS — L820 Inflamed seborrheic keratosis: Secondary | ICD-10-CM | POA: Diagnosis not present

## 2021-12-16 DIAGNOSIS — Z85828 Personal history of other malignant neoplasm of skin: Secondary | ICD-10-CM | POA: Diagnosis not present

## 2021-12-16 DIAGNOSIS — L814 Other melanin hyperpigmentation: Secondary | ICD-10-CM | POA: Diagnosis not present

## 2022-02-05 NOTE — Progress Notes (Signed)
 Subjective   Patient ID:  Roberto Gill is a 67 y.o. (DOB 1955-07-21) male    Patient presents with  . Ankle Pain    Onset RIGHT ankle pain 4 days prior / LAT MAL area and heel areas / no injury     HPI 67 year old male presents with right ankle pain.  Reports he did not turn his ankle, or no injury.  About a week ago pain started in his left ankle and is now moved to the right.  Denies fever.  He used ibuprofen several days ago with minimal relief.  He does not have a history of gout.  Blood pressure elevated this visit.  He has not taken his blood pressure medicine this morning.   Reviewed and updated this visit by provider: Tobacco  Allergies  Meds  Problems  Med Hx  Surg Hx  Fam Hx        Review of Systems  Negative except for HPI Objective   Vitals:   02/05/22 0910 02/05/22 0938  BP: (!) 222/101 (!) 180/83  Pulse: 56   Temp: 98.1 F (36.7 C)   TempSrc: Oral   Height: 5' 10 (1.778 m)   Weight: 206 lb (93.4 kg)   SpO2: 98%   BMI (Calculated): 29.6      Physical Exam Constitutional:      General: He is not in acute distress.    Appearance: Normal appearance. He is not ill-appearing, toxic-appearing or diaphoretic.  HENT:     Head: Normocephalic and atraumatic.  Eyes:     Extraocular Movements: Extraocular movements intact.     Conjunctiva/sclera: Conjunctivae normal.  Cardiovascular:     Rate and Rhythm: Normal rate and regular rhythm.     Pulses: Normal pulses.     Heart sounds: Normal heart sounds. No murmur heard.    No friction rub.  Musculoskeletal:        General: Tenderness present. No swelling, deformity or signs of injury.     Right lower leg: No edema.     Left lower leg: No edema.     Comments: Right achilles tender to palpation. No rupture. When squeezing calf foot flexed  Pulmonary:     Effort: Pulmonary effort is normal. No respiratory distress.     Breath sounds: Normal breath sounds. No stridor. No wheezing, rhonchi or rales.  Skin:     Capillary Refill: Capillary refill takes less than 2 seconds.     Coloration: Skin is not jaundiced or pale.     Findings: No bruising, erythema, lesion or rash.  Neurological:     General: No focal deficit present.     Mental Status: He is alert.  Psychiatric:        Mood and Affect: Mood normal.         No results found for this or any previous visit.   No results found.  Assessment and Plan  1. Tendonitis of ankle (Primary) -     methylPREDNISolone (MEDROL DOSEPACK) 4 mg tablet; follow package directions., Normal -     Ambulatory referral to Orthopedic Surgery 2. Elevated blood pressure reading 3. Right foot pain     Blood pressure elevated this visit.  Patient has not taken his lisinopril this morning he plans to take as soon as he gets home.  Follow-up with PCP and monitor readings. Suspect Achilles tendinitis.  Ace wrap applied in clinic.  Medrol Dosepak as prescribed.  Do not take NSAIDs while on Dosepak.  May take Tylenol   OTC as needed for pain.  Orthopedic referral made for follow-up.  Elevate right lower extremity is much as possible.  Gentle exercises and stretching recommended Apply ice 20 minutes on 20 minutes off  Follow up for PCP, ED if symptoms worsen.    Risks, benefits, and alternatives of the medications and treatment plan prescribed today were discussed, and patient expressed understanding. Plan follow-up as discussed or as needed if any worsening symptoms or change in condition.

## 2022-03-21 DIAGNOSIS — I10 Essential (primary) hypertension: Secondary | ICD-10-CM | POA: Diagnosis not present

## 2022-03-21 DIAGNOSIS — Z0000 Encounter for general adult medical examination without abnormal findings: Secondary | ICD-10-CM | POA: Diagnosis not present

## 2022-03-21 DIAGNOSIS — Z125 Encounter for screening for malignant neoplasm of prostate: Secondary | ICD-10-CM | POA: Diagnosis not present

## 2022-03-21 DIAGNOSIS — N529 Male erectile dysfunction, unspecified: Secondary | ICD-10-CM | POA: Diagnosis not present

## 2022-03-21 DIAGNOSIS — N1831 Chronic kidney disease, stage 3a: Secondary | ICD-10-CM | POA: Diagnosis not present

## 2022-06-20 DIAGNOSIS — L814 Other melanin hyperpigmentation: Secondary | ICD-10-CM | POA: Diagnosis not present

## 2022-06-20 DIAGNOSIS — S80862A Insect bite (nonvenomous), left lower leg, initial encounter: Secondary | ICD-10-CM | POA: Diagnosis not present

## 2022-06-20 DIAGNOSIS — L570 Actinic keratosis: Secondary | ICD-10-CM | POA: Diagnosis not present

## 2022-06-20 DIAGNOSIS — Z85828 Personal history of other malignant neoplasm of skin: Secondary | ICD-10-CM | POA: Diagnosis not present

## 2022-06-20 DIAGNOSIS — L821 Other seborrheic keratosis: Secondary | ICD-10-CM | POA: Diagnosis not present

## 2022-11-10 DIAGNOSIS — M25572 Pain in left ankle and joints of left foot: Secondary | ICD-10-CM | POA: Diagnosis not present

## 2022-11-10 DIAGNOSIS — M76822 Posterior tibial tendinitis, left leg: Secondary | ICD-10-CM | POA: Diagnosis not present

## 2022-11-28 DIAGNOSIS — S93422A Sprain of deltoid ligament of left ankle, initial encounter: Secondary | ICD-10-CM | POA: Diagnosis not present

## 2022-11-28 DIAGNOSIS — M76822 Posterior tibial tendinitis, left leg: Secondary | ICD-10-CM | POA: Diagnosis not present

## 2022-11-28 DIAGNOSIS — M7672 Peroneal tendinitis, left leg: Secondary | ICD-10-CM | POA: Diagnosis not present

## 2022-12-20 DIAGNOSIS — D485 Neoplasm of uncertain behavior of skin: Secondary | ICD-10-CM | POA: Diagnosis not present

## 2022-12-20 DIAGNOSIS — L821 Other seborrheic keratosis: Secondary | ICD-10-CM | POA: Diagnosis not present

## 2022-12-20 DIAGNOSIS — L570 Actinic keratosis: Secondary | ICD-10-CM | POA: Diagnosis not present

## 2022-12-20 DIAGNOSIS — Z85828 Personal history of other malignant neoplasm of skin: Secondary | ICD-10-CM | POA: Diagnosis not present

## 2022-12-20 DIAGNOSIS — L814 Other melanin hyperpigmentation: Secondary | ICD-10-CM | POA: Diagnosis not present

## 2022-12-20 DIAGNOSIS — D692 Other nonthrombocytopenic purpura: Secondary | ICD-10-CM | POA: Diagnosis not present

## 2023-08-08 ENCOUNTER — Emergency Department (HOSPITAL_COMMUNITY)
Admission: EM | Admit: 2023-08-08 | Discharge: 2023-08-08 | Disposition: A | Discharge status: Home / Self Care | Attending: Emergency Medicine | Admitting: Emergency Medicine

## 2023-08-08 ENCOUNTER — Other Ambulatory Visit: Payer: Self-pay

## 2023-08-08 DIAGNOSIS — T7840XA Allergy, unspecified, initial encounter: Secondary | ICD-10-CM | POA: Insufficient documentation

## 2023-08-08 MED ORDER — DIPHENHYDRAMINE HCL 25 MG PO CAPS
25.0000 mg | ORAL_CAPSULE | Freq: Once | ORAL | Status: AC
  Administered 2023-08-08: 25 mg via ORAL
  Filled 2023-08-08: qty 1

## 2023-08-08 NOTE — ED Provider Notes (Signed)
 [place] EMERGENCY DEPARTMENT AT [facility]  Provider Note  CSN: [id number] Arrival date & time: 08/08/23 0118  History Chief Complaint  Patient presents with   Allergic Reaction    NAVJOT LOERA is a 68 y.o. male here with wife for evaluation of possible allergic reaction. He reports he was in his usual state of health until a short time prior to arrival when he began to have hives, itching and burning to his waistline, hands and feet. He denies any new soaps, lotions, detergents, foods, etc. His wife reports he was 'shaking' so she was unsure about giving him some benadryl  and brought him to the ED. He reports feeling some dry mouth but no tongue/lip swelling or difficulty breathing/swallowing. He reports he is feeling some better now. He reports recent increased stress due to family illness.    Home Medications Prior to Admission medications   Medication Sig Start Date End Date Taking? Authorizing Provider  cephALEXin  (KEFLEX ) 500 MG capsule Take 1 capsule (500 mg total) by mouth 2 (two) times daily. Patient not taking: Reported on 11/23/2021 06/07/13   Anitra Rocky KIDD, PA-C  lisinopril (PRINIVIL,ZESTRIL) 10 MG tablet Take 10 mg by mouth daily.    [provider]  oxyCODONE -acetaminophen  (PERCOCET/ROXICET) 5-325 MG per tablet Take 1-2 tablets by mouth every 6 (six) hours as needed for moderate pain or severe pain. 06/07/13   Anitra Rocky KIDD, PA-C  sulfamethoxazole -trimethoprim  (BACTRIM  DS) 800-160 MG per tablet Take 1 tablet by mouth 2 (two) times daily. Patient not taking: Reported on 11/23/2021 06/07/13   Anitra Rocky KIDD, PA-C     Allergies    Patient has no known allergies.   Review of Systems   Review of Systems Please see HPI for pertinent positives and negatives  Physical Exam BP 135/70   Pulse 61   Temp 98.1 F (36.7 C) (Oral)   Resp 18   SpO2 93%   Physical Exam Vitals and nursing note reviewed.  Constitutional:      Appearance: Normal  appearance.  HENT:     Head: Normocephalic and atraumatic.     Nose: Nose normal.     Mouth/Throat:     Mouth: Mucous membranes are moist.  Eyes:     Extraocular Movements: Extraocular movements intact.     Conjunctiva/sclera: Conjunctivae normal.  Cardiovascular:     Rate and Rhythm: Normal rate.  Pulmonary:     Effort: Pulmonary effort is normal.     Breath sounds: Normal breath sounds.  Abdominal:     General: Abdomen is flat.     Palpations: Abdomen is soft.     Tenderness: There is no abdominal tenderness.  Musculoskeletal:        General: No swelling. Normal range of motion.     Cervical back: Neck supple.  Skin:    General: Skin is warm and dry.     Findings: No rash.  Neurological:     General: No focal deficit present.     Mental Status: He is alert.  Psychiatric:        Mood and Affect: Mood normal.     ED Results / Procedures / Treatments   EKG None  Procedures Procedures  Medications Ordered in the ED Medications  diphenhydrAMINE  (BENADRYL ) capsule 25 mg (25 mg Oral Given 08/08/23 0136)    Initial Impression and Plan  Patient here with symptoms concerning for allergic reaction to unknown allergen. His rash has gone now and he appears comfortable. Unsure  what the shaking wife reported would be from, he is not febrile here. Discussed treatment with antihistamines and steroids, but he does not like the side effects from steroids and would like to avoid them for now. Will give a dose of benadryl  and monitor in the ED.   ED Course   Clinical Course as of 08/08/23 0209  Tue Aug 08, 2023  0208 Patient reports he is feeling better and wants to go home. Recommend he continue to use antihistamines as needed, PCP follow up, RTED for any worsening rash, difficulty breathing or other concerns.   [CS]    Clinical Course User Index [CS] Roselyn Carlin NOVAK, MD     MDM Rules/Calculators/A&P Medical Decision Making Problems Addressed: Allergic reaction, initial  encounter: acute illness or injury  Risk OTC drugs.     Final Clinical Impression(s) / ED Diagnoses Final diagnoses:  Allergic reaction, initial encounter    Rx / DC Orders ED Discharge Orders     None        Roselyn Carlin NOVAK, MD 08/08/23 [phone]

## 2023-08-08 NOTE — ED Triage Notes (Signed)
 Pt states he woke up with itching all over, reports rash around his waistline. Denies any known allergies

## 2023-08-08 NOTE — ED Notes (Signed)
 ED Provider at bedside.
